# Patient Record
Sex: Male | Born: 1985
Health system: Southern US, Community
[De-identification: ages and names within clinical notes are randomized; demographics above are authoritative.]

---

## 2018-12-05 ENCOUNTER — Observation Stay (HOSPITAL_COMMUNITY): Payer: No Typology Code available for payment source | Admitting: Anesthesiology

## 2018-12-05 ENCOUNTER — Emergency Department (HOSPITAL_COMMUNITY): Payer: No Typology Code available for payment source

## 2018-12-05 ENCOUNTER — Encounter (HOSPITAL_COMMUNITY): Admission: EM | Disposition: A | Discharge status: Home / Self Care | Payer: Self-pay | Source: Home / Self Care

## 2018-12-05 ENCOUNTER — Encounter (HOSPITAL_COMMUNITY): Payer: Self-pay

## 2018-12-05 ENCOUNTER — Inpatient Hospital Stay (HOSPITAL_COMMUNITY)
Admission: EM | Admit: 2018-12-05 | Discharge: 2018-12-07 | DRG: 501 | Disposition: A | Discharge status: Home / Self Care | Payer: No Typology Code available for payment source | Attending: General Surgery | Admitting: General Surgery

## 2018-12-05 DIAGNOSIS — S31821A Laceration without foreign body of left buttock, initial encounter: Secondary | ICD-10-CM | POA: Diagnosis present

## 2018-12-05 DIAGNOSIS — Y92410 Unspecified street and highway as the place of occurrence of the external cause: Secondary | ICD-10-CM

## 2018-12-05 DIAGNOSIS — S32039A Unspecified fracture of third lumbar vertebra, initial encounter for closed fracture: Secondary | ICD-10-CM | POA: Diagnosis present

## 2018-12-05 DIAGNOSIS — S32009A Unspecified fracture of unspecified lumbar vertebra, initial encounter for closed fracture: Secondary | ICD-10-CM

## 2018-12-05 DIAGNOSIS — T1490XA Injury, unspecified, initial encounter: Secondary | ICD-10-CM | POA: Diagnosis present

## 2018-12-05 DIAGNOSIS — S81812A Laceration without foreign body, left lower leg, initial encounter: Secondary | ICD-10-CM

## 2018-12-05 DIAGNOSIS — S060X1A Concussion with loss of consciousness of 30 minutes or less, initial encounter: Secondary | ICD-10-CM

## 2018-12-05 DIAGNOSIS — M549 Dorsalgia, unspecified: Secondary | ICD-10-CM | POA: Diagnosis not present

## 2018-12-05 DIAGNOSIS — S32049A Unspecified fracture of fourth lumbar vertebra, initial encounter for closed fracture: Secondary | ICD-10-CM | POA: Diagnosis present

## 2018-12-05 DIAGNOSIS — S32000A Wedge compression fracture of unspecified lumbar vertebra, initial encounter for closed fracture: Secondary | ICD-10-CM | POA: Diagnosis present

## 2018-12-05 DIAGNOSIS — I959 Hypotension, unspecified: Secondary | ICD-10-CM | POA: Diagnosis present

## 2018-12-05 DIAGNOSIS — S32029A Unspecified fracture of second lumbar vertebra, initial encounter for closed fracture: Secondary | ICD-10-CM | POA: Diagnosis not present

## 2018-12-05 HISTORY — PX: I & D EXTREMITY: SHX5045

## 2018-12-05 LAB — PROTIME-INR
INR: 1.3 — ABNORMAL HIGH (ref 0.8–1.2)
Prothrombin Time: 15.6 seconds — ABNORMAL HIGH (ref 11.4–15.2)

## 2018-12-05 LAB — ETHANOL

## 2018-12-05 LAB — CBC
HCT: 45 % (ref 39.0–52.0)
Hemoglobin: 14.7 g/dL (ref 13.0–17.0)
MCH: 29.4 pg (ref 26.0–34.0)
MCHC: 32.7 g/dL (ref 30.0–36.0)
MCV: 90 fL (ref 80.0–100.0)
NRBC: 0 % (ref 0.0–0.2)
Platelets: 197 10*3/uL (ref 150–400)
RBC: 5 MIL/uL (ref 4.22–5.81)
RDW: 11.3 % — ABNORMAL LOW (ref 11.5–15.5)
WBC: 17.5 10*3/uL — ABNORMAL HIGH (ref 4.0–10.5)

## 2018-12-05 LAB — SAMPLE TO BLOOD BANK

## 2018-12-05 LAB — COMPREHENSIVE METABOLIC PANEL
ALT: 83 U/L — ABNORMAL HIGH (ref 0–44)
AST: 154 U/L — ABNORMAL HIGH (ref 15–41)
Albumin: 4.3 g/dL (ref 3.5–5.0)
Alkaline Phosphatase: 54 U/L (ref 38–126)
Anion gap: 10 (ref 5–15)
BILIRUBIN TOTAL: 0.8 mg/dL (ref 0.3–1.2)
BUN: 15 mg/dL (ref 6–20)
CO2: 21 mmol/L — ABNORMAL LOW (ref 22–32)
Calcium: 8.8 mg/dL — ABNORMAL LOW (ref 8.9–10.3)
Chloride: 107 mmol/L (ref 98–111)
Creatinine, Ser: 1.36 mg/dL — ABNORMAL HIGH (ref 0.61–1.24)
GFR calc Af Amer: >60 mL/min (ref >60)
Glucose, Bld: 118 mg/dL — ABNORMAL HIGH (ref 70–99)
Potassium: 3.7 mmol/L (ref 3.5–5.1)
Sodium: 138 mmol/L (ref 135–145)
TOTAL PROTEIN: 6.9 g/dL (ref 6.5–8.1)

## 2018-12-05 LAB — LACTIC ACID, PLASMA: Lactic Acid, Venous: 2.5 mmol/L (ref 0.5–1.9)

## 2018-12-05 SURGERY — IRRIGATION AND DEBRIDEMENT EXTREMITY
Anesthesia: General

## 2018-12-05 MED ORDER — PROPOFOL 10 MG/ML IV BOLUS
INTRAVENOUS | Status: AC
  Filled 2018-12-05: qty 20

## 2018-12-05 MED ORDER — MIDAZOLAM HCL 5 MG/5ML IJ SOLN
INTRAMUSCULAR | Status: DC | PRN
  Administered 2018-12-05: 2 mg via INTRAVENOUS

## 2018-12-05 MED ORDER — METHOCARBAMOL 1000 MG/10ML IJ SOLN: 500.0000 mg | Freq: Four times a day (QID) | INTRAVENOUS | Status: DC | PRN

## 2018-12-05 MED ORDER — LIDOCAINE HCL (CARDIAC) PF 100 MG/5ML IV SOSY
PREFILLED_SYRINGE | INTRAVENOUS | Status: DC | PRN
  Administered 2018-12-05: 60 mg via INTRATRACHEAL

## 2018-12-05 MED ORDER — ONDANSETRON HCL 4 MG/2ML IJ SOLN
4.0000 mg | Freq: Once | INTRAMUSCULAR | Status: AC
  Administered 2018-12-05: 4 mg via INTRAVENOUS
  Filled 2018-12-05: qty 2

## 2018-12-05 MED ORDER — SUCCINYLCHOLINE CHLORIDE 200 MG/10ML IV SOSY
PREFILLED_SYRINGE | INTRAVENOUS | Status: AC
  Filled 2018-12-05: qty 10

## 2018-12-05 MED ORDER — IOHEXOL 300 MG/ML  SOLN
100.0000 mL | Freq: Once | INTRAMUSCULAR | Status: AC | PRN
  Administered 2018-12-05: 100 mL via INTRAVENOUS

## 2018-12-05 MED ORDER — FENTANYL CITRATE (PF) 100 MCG/2ML IJ SOLN
50.0000 ug | Freq: Once | INTRAMUSCULAR | Status: AC
  Administered 2018-12-05: 50 ug via INTRAVENOUS
  Filled 2018-12-05: qty 2

## 2018-12-05 MED ORDER — ENOXAPARIN SODIUM 40 MG/0.4ML ~~LOC~~ SOLN
40.0000 mg | SUBCUTANEOUS | Status: DC
  Administered 2018-12-06 – 2018-12-07 (×2): 40 mg via SUBCUTANEOUS
  Filled 2018-12-05 (×2): qty 0.4

## 2018-12-05 MED ORDER — FENTANYL CITRATE (PF) 100 MCG/2ML IJ SOLN
100.0000 ug | INTRAMUSCULAR | Status: DC | PRN
  Administered 2018-12-05 – 2018-12-06 (×3): 100 ug via INTRAVENOUS
  Filled 2018-12-05 (×4): qty 2

## 2018-12-05 MED ORDER — HYDRALAZINE HCL 20 MG/ML IJ SOLN: 10.0000 mg | INTRAMUSCULAR | Status: DC | PRN

## 2018-12-05 MED ORDER — ARTIFICIAL TEARS OPHTHALMIC OINT
TOPICAL_OINTMENT | OPHTHALMIC | Status: AC
  Filled 2018-12-05: qty 3.5

## 2018-12-05 MED ORDER — GABAPENTIN 300 MG PO CAPS
300.0000 mg | ORAL_CAPSULE | Freq: Three times a day (TID) | ORAL | Status: DC
  Administered 2018-12-06 – 2018-12-07 (×4): 300 mg via ORAL
  Filled 2018-12-05 (×4): qty 1

## 2018-12-05 MED ORDER — ONDANSETRON HCL 4 MG/2ML IJ SOLN: 4.0000 mg | Freq: Four times a day (QID) | INTRAMUSCULAR | Status: DC | PRN

## 2018-12-05 MED ORDER — SODIUM CHLORIDE 0.9 % IV SOLN
INTRAVENOUS | Status: DC
  Administered 2018-12-06: 02:00:00 via INTRAVENOUS

## 2018-12-05 MED ORDER — OXYCODONE HCL 5 MG PO TABS
5.0000 mg | ORAL_TABLET | ORAL | Status: DC | PRN
  Administered 2018-12-06 – 2018-12-07 (×4): 5 mg via ORAL
  Filled 2018-12-05 (×4): qty 1

## 2018-12-05 MED ORDER — LACTATED RINGERS IV SOLN
INTRAVENOUS | Status: DC | PRN
  Administered 2018-12-05: 22:00:00 via INTRAVENOUS

## 2018-12-05 MED ORDER — SUCCINYLCHOLINE CHLORIDE 20 MG/ML IJ SOLN
INTRAMUSCULAR | Status: DC | PRN
  Administered 2018-12-05: 120 mg via INTRAVENOUS

## 2018-12-05 MED ORDER — SODIUM CHLORIDE 0.9 % IV SOLN
INTRAVENOUS | Status: DC
  Administered 2018-12-06 – 2018-12-07 (×3): via INTRAVENOUS

## 2018-12-05 MED ORDER — LIDOCAINE 2% (20 MG/ML) 5 ML SYRINGE
INTRAMUSCULAR | Status: AC
  Filled 2018-12-05: qty 5

## 2018-12-05 MED ORDER — METOPROLOL TARTRATE 5 MG/5ML IV SOLN: 5.0000 mg | Freq: Four times a day (QID) | INTRAVENOUS | Status: DC | PRN

## 2018-12-05 MED ORDER — HYDROMORPHONE HCL 1 MG/ML IJ SOLN
0.5000 mg | INTRAMUSCULAR | Status: DC | PRN
  Administered 2018-12-06 (×2): 0.5 mg via INTRAVENOUS
  Filled 2018-12-05 (×2): qty 1

## 2018-12-05 MED ORDER — DOCUSATE SODIUM 100 MG PO CAPS
100.0000 mg | ORAL_CAPSULE | Freq: Two times a day (BID) | ORAL | Status: DC
  Administered 2018-12-06 – 2018-12-07 (×3): 100 mg via ORAL
  Filled 2018-12-05 (×3): qty 1

## 2018-12-05 MED ORDER — BACITRACIN ZINC 500 UNIT/GM EX OINT
TOPICAL_OINTMENT | CUTANEOUS | Status: AC
  Filled 2018-12-05: qty 28.35

## 2018-12-05 MED ORDER — SODIUM CHLORIDE 0.9 % IV BOLUS
2000.0000 mL | Freq: Once | INTRAVENOUS | Status: AC
  Administered 2018-12-05: 2000 mL via INTRAVENOUS

## 2018-12-05 MED ORDER — EPHEDRINE 5 MG/ML INJ
INTRAVENOUS | Status: AC
  Filled 2018-12-05: qty 10

## 2018-12-05 MED ORDER — MIDAZOLAM HCL 2 MG/2ML IJ SOLN
INTRAMUSCULAR | Status: AC
  Filled 2018-12-05: qty 2

## 2018-12-05 MED ORDER — ACETAMINOPHEN 500 MG PO TABS
1000.0000 mg | ORAL_TABLET | Freq: Four times a day (QID) | ORAL | Status: DC
  Administered 2018-12-06 – 2018-12-07 (×5): 1000 mg via ORAL
  Filled 2018-12-05 (×6): qty 2

## 2018-12-05 MED ORDER — CEFAZOLIN SODIUM-DEXTROSE 2-4 GM/100ML-% IV SOLN
2.0000 g | Freq: Three times a day (TID) | INTRAVENOUS | Status: DC
  Administered 2018-12-06 – 2018-12-07 (×4): 2 g via INTRAVENOUS
  Filled 2018-12-05 (×6): qty 100

## 2018-12-05 MED ORDER — CEFAZOLIN SODIUM-DEXTROSE 2-4 GM/100ML-% IV SOLN
2.0000 g | Freq: Once | INTRAVENOUS | Status: AC
  Administered 2018-12-05: 2 g via INTRAVENOUS
  Filled 2018-12-05: qty 100

## 2018-12-05 MED ORDER — FENTANYL CITRATE (PF) 250 MCG/5ML IJ SOLN
INTRAMUSCULAR | Status: AC
  Filled 2018-12-05: qty 5

## 2018-12-05 MED ORDER — BISACODYL 10 MG RE SUPP: 10.0000 mg | Freq: Every day | RECTAL | Status: DC | PRN

## 2018-12-05 MED ORDER — FENTANYL CITRATE (PF) 250 MCG/5ML IJ SOLN
INTRAMUSCULAR | Status: DC | PRN
  Administered 2018-12-05: 50 ug via INTRAVENOUS
  Administered 2018-12-05: 100 ug via INTRAVENOUS
  Administered 2018-12-05 (×2): 50 ug via INTRAVENOUS

## 2018-12-05 MED ORDER — SODIUM CHLORIDE 0.9 % IV BOLUS
1000.0000 mL | Freq: Once | INTRAVENOUS | Status: AC
  Administered 2018-12-05: 1000 mL via INTRAVENOUS

## 2018-12-05 MED ORDER — ONDANSETRON 4 MG PO TBDP: 4.0000 mg | ORAL_TABLET | Freq: Four times a day (QID) | ORAL | Status: DC | PRN

## 2018-12-05 MED ORDER — PROPOFOL 10 MG/ML IV BOLUS
INTRAVENOUS | Status: DC | PRN
  Administered 2018-12-05: 200 mg via INTRAVENOUS

## 2018-12-05 SURGICAL SUPPLY — 26 items
CANISTER SUCT 3000ML PPV (MISCELLANEOUS) ×3 IMPLANT
COVER SURGICAL LIGHT HANDLE (MISCELLANEOUS) ×3 IMPLANT
DRAPE LAPAROTOMY 100X72 PEDS (DRAPES) ×3 IMPLANT
DRSG PAD ABDOMINAL 8X10 ST (GAUZE/BANDAGES/DRESSINGS) IMPLANT
ELECT CAUTERY BLADE 6.4 (BLADE) ×3 IMPLANT
ELECT REM PT RETURN 9FT ADLT (ELECTROSURGICAL) ×3
ELECTRODE REM PT RTRN 9FT ADLT (ELECTROSURGICAL) ×1 IMPLANT
GAUZE SPONGE 4X4 12PLY STRL (GAUZE/BANDAGES/DRESSINGS) ×6 IMPLANT
GLOVE BIO SURGEON STRL SZ 6 (GLOVE) ×3 IMPLANT
GLOVE INDICATOR 6.5 STRL GRN (GLOVE) ×3 IMPLANT
GOWN STRL REUS W/ TWL LRG LVL3 (GOWN DISPOSABLE) ×2 IMPLANT
GOWN STRL REUS W/TWL LRG LVL3 (GOWN DISPOSABLE) ×4
KIT BASIN OR (CUSTOM PROCEDURE TRAY) ×3 IMPLANT
KIT TURNOVER KIT B (KITS) ×3 IMPLANT
NS IRRIG 1000ML POUR BTL (IV SOLUTION) ×3 IMPLANT
PACK SURGICAL SETUP 50X90 (CUSTOM PROCEDURE TRAY) ×3 IMPLANT
PAD ARMBOARD 7.5X6 YLW CONV (MISCELLANEOUS) ×6 IMPLANT
PENCIL BUTTON HOLSTER BLD 10FT (ELECTRODE) ×3 IMPLANT
SPONGE LAP 18X18 RF (DISPOSABLE) ×3 IMPLANT
SUT VIC AB 3-0 SH 8-18 (SUTURE) ×3 IMPLANT
TAPE CLOTH SURG 6X10 WHT LF (GAUZE/BANDAGES/DRESSINGS) ×3 IMPLANT
TOWEL OR 17X26 10 PK STRL BLUE (TOWEL DISPOSABLE) ×3 IMPLANT
TUBE CONNECTING 12'X1/4 (SUCTIONS) ×1
TUBE CONNECTING 12X1/4 (SUCTIONS) ×2 IMPLANT
UNDERPAD 30X30 INCONTINENT (UNDERPADS AND DIAPERS) ×3 IMPLANT
YANKAUER SUCT BULB TIP NO VENT (SUCTIONS) ×3 IMPLANT

## 2018-12-05 NOTE — Anesthesia Procedure Notes (Signed)
Procedure Name: Intubation Date/Time: 12/05/2018 10:53 PM Performed by: Claudina Lick, CRNA Pre-anesthesia Checklist: Patient identified, Emergency Drugs available, Suction available, Patient being monitored and Timeout performed Patient Re-evaluated:Patient Re-evaluated prior to induction Oxygen Delivery Method: Circle system utilized Preoxygenation: Pre-oxygenation with 100% oxygen Induction Type: IV induction, Rapid sequence and Cricoid Pressure applied Laryngoscope Size: Miller and 2 Grade View: Grade I Tube type: Oral Tube size: 7.5 mm Number of attempts: 1 Airway Equipment and Method: Stylet Placement Confirmation: ETT inserted through vocal cords under direct vision,  positive ETCO2 and breath sounds checked- equal and bilateral Secured at: 22 cm Tube secured with: Tape Dental Injury: Teeth and Oropharynx as per pre-operative assessment
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
## 2018-12-05 NOTE — ED Notes (Signed)
Report given to OR RN. All questions answered.

## 2018-12-05 NOTE — Anesthesia Preprocedure Evaluation (Addendum)
Anesthesia Evaluation  Patient identified by MRN, date of birth, ID band Patient awake    Reviewed: Allergy & Precautions, NPO status , Patient's Chart, lab work & pertinent test results  Airway Mallampati: II  TM Distance: >3 FB Neck ROM: Full    Dental  (+) Teeth Intact, Dental Advisory Given   Pulmonary neg pulmonary ROS,    breath sounds clear to auscultation       Cardiovascular  Rhythm:Regular Rate:Normal     Neuro/Psych negative neurological ROS     GI/Hepatic negative GI ROS, Neg liver ROS,   Endo/Other  negative endocrine ROS  Renal/GU negative Renal ROS     Musculoskeletal negative musculoskeletal ROS (+)   Abdominal Normal abdominal exam  (+)   Peds  Hematology negative hematology ROS (+)   Anesthesia Other Findings   Reproductive/Obstetrics                            Anesthesia Physical Anesthesia Plan  ASA: II  Anesthesia Plan: General   Post-op Pain Management:    Induction: Intravenous, Rapid sequence and Cricoid pressure planned  PONV Risk Score and Plan: 3 and Ondansetron, Dexamethasone and Midazolam  Airway Management Planned: Oral ETT  Additional Equipment: None  Intra-op Plan:   Post-operative Plan: Extubation in OR  Informed Consent: I have reviewed the patients History and Physical, chart, labs and discussed the procedure including the risks, benefits and alternatives for the proposed anesthesia with the patient or authorized representative who has indicated his/her understanding and acceptance.     Dental advisory given  Plan Discussed with: CRNA  Anesthesia Plan Comments:       Anesthesia Quick Evaluation
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
## 2018-12-05 NOTE — ED Notes (Addendum)
No vitals documented from previous shift. Current vitals now verified

## 2018-12-05 NOTE — Op Note (Signed)
Operative Note  Paul Johnson  [id number]  [id number]  12/05/2018   Surgeon: Leeroy Bock A ConnorMD  Assistant: none  Procedure performed: debridement, irrigation and layered closure of left buttock wound, 10cm x 3cm x 10cm deep involving muscle and fascia  Preop diagnosis: traumatic laceration left buttock Post-op diagnosis/intraop findings: same  Specimens: no Retained items: no EBL: minimal cc Complications: none  Description of procedure: After obtaining informed consent the patient was taken to the operating room wheregeneral endotracheal anesthesia was initiated, preoperative antibiotics were administered, SCDs applied, and a formal timeout was performed. The patient was carefully positioned in the right lateral decubitus position with all appropriate padding and secured with straps. The left buttock was prepped and draped in usual sterile fashion using Betadine. The wound was then irrigated copiously with warm sterile saline and hemostasis ensured with cautery. There was no active vessel bleeding but a small amount of muscle belly oozing was present. The wound was very closely inspected for foreign debris and all that could be found was removed. The laceration is noted to transect multiple segments of the gluteus muscle and at the deepest aspect of the wound, the ischial tuberosity is palpable with a small amount of muscle overlying it. The wound was once again irrigated and hemostasis confirmed. At this juncture the wound was closed with interrupted 3-0 Vicryl in the fascia, interrupted deep dermal 3-0 Vicryl's, and then simple interrupted subcuticular 4-0 Monocryl. Bacitracin and a dry gauze dressing were then applied. The patient was then returned to the supine position, awakened, extubated and taken to PACU in stable condition.   All counts were correct at the completion of the case.

## 2018-12-05 NOTE — ED Notes (Signed)
RN sent 2 visitors back

## 2018-12-05 NOTE — Transfer of Care (Signed)
Immediate Anesthesia Transfer of Care Note  Patient: Paul Johnson  Procedure(s) Performed: IRRIGATION AND DEBRIDEMENT, CLOSURE OF BUTTOCK WOUND (N/A )  Patient Location: PACU  Anesthesia Type:General  Level of Consciousness: awake  Airway & Oxygen Therapy: Patient Spontanous Breathing and Patient connected to face mask oxygen  Post-op Assessment: Report given to RN and Post -op Vital signs reviewed and stable  Post vital signs: Reviewed and stable  Last Vitals:  Vitals Value Taken Time  BP 107/75 12/05/2018 11:36 PM  Temp    Pulse 111 12/05/2018 11:36 PM  Resp 39 12/05/2018 11:36 PM  SpO2 93 % 12/05/2018 11:36 PM  Vitals shown include unvalidated device data.  Last Pain:  Vitals:   12/05/18 2123  TempSrc:   PainSc: 10-Worst pain ever         Complications: No apparent anesthesia complications
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
## 2018-12-06 ENCOUNTER — Encounter (HOSPITAL_COMMUNITY): Payer: Self-pay | Admitting: Surgery

## 2018-12-06 DIAGNOSIS — S060X1A Concussion with loss of consciousness of 30 minutes or less, initial encounter: Secondary | ICD-10-CM | POA: Diagnosis present

## 2018-12-06 DIAGNOSIS — Y92410 Unspecified street and highway as the place of occurrence of the external cause: Secondary | ICD-10-CM | POA: Diagnosis not present

## 2018-12-06 DIAGNOSIS — I959 Hypotension, unspecified: Secondary | ICD-10-CM | POA: Diagnosis present

## 2018-12-06 DIAGNOSIS — S31821A Laceration without foreign body of left buttock, initial encounter: Secondary | ICD-10-CM | POA: Diagnosis present

## 2018-12-06 DIAGNOSIS — S32049A Unspecified fracture of fourth lumbar vertebra, initial encounter for closed fracture: Secondary | ICD-10-CM | POA: Diagnosis present

## 2018-12-06 DIAGNOSIS — S32039A Unspecified fracture of third lumbar vertebra, initial encounter for closed fracture: Secondary | ICD-10-CM | POA: Diagnosis present

## 2018-12-06 DIAGNOSIS — S32029A Unspecified fracture of second lumbar vertebra, initial encounter for closed fracture: Secondary | ICD-10-CM | POA: Diagnosis present

## 2018-12-06 DIAGNOSIS — M549 Dorsalgia, unspecified: Secondary | ICD-10-CM | POA: Diagnosis present

## 2018-12-06 DIAGNOSIS — T1490XA Injury, unspecified, initial encounter: Secondary | ICD-10-CM | POA: Diagnosis present

## 2018-12-06 LAB — BASIC METABOLIC PANEL
Anion gap: 10 (ref 5–15)
BUN: 12 mg/dL (ref 6–20)
CO2: 21 mmol/L — ABNORMAL LOW (ref 22–32)
Calcium: 7.9 mg/dL — ABNORMAL LOW (ref 8.9–10.3)
Chloride: 106 mmol/L (ref 98–111)
Creatinine, Ser: 1.29 mg/dL — ABNORMAL HIGH (ref 0.61–1.24)
GFR calc Af Amer: >60 mL/min (ref >60)
GFR calc non Af Amer: >60 mL/min (ref >60)
Glucose, Bld: 107 mg/dL — ABNORMAL HIGH (ref 70–99)
Potassium: 5.1 mmol/L (ref 3.5–5.1)
Sodium: 137 mmol/L (ref 135–145)

## 2018-12-06 LAB — HIV ANTIBODY (ROUTINE TESTING W REFLEX): HIV Screen 4th Generation wRfx: NONREACTIVE

## 2018-12-06 LAB — CBC
HCT: 38.7 % — ABNORMAL LOW (ref 39.0–52.0)
Hemoglobin: 13 g/dL (ref 13.0–17.0)
MCH: 30.2 pg (ref 26.0–34.0)
MCHC: 33.6 g/dL (ref 30.0–36.0)
MCV: 90 fL (ref 80.0–100.0)
Platelets: 150 10*3/uL (ref 150–400)
RBC: 4.3 MIL/uL (ref 4.22–5.81)
RDW: 11.5 % (ref 11.5–15.5)
WBC: 10.3 10*3/uL (ref 4.0–10.5)
nRBC: 0 % (ref 0.0–0.2)

## 2018-12-06 MED ORDER — HYDROMORPHONE HCL 1 MG/ML IJ SOLN: 0.2500 mg | INTRAMUSCULAR | Status: DC | PRN

## 2018-12-06 MED ORDER — LACTATED RINGERS IV SOLN: INTRAVENOUS | Status: DC

## 2018-12-06 MED ORDER — ACETAMINOPHEN 160 MG/5ML PO SOLN: 325.0000 mg | Freq: Once | ORAL | Status: DC

## 2018-12-06 MED ORDER — MEPERIDINE HCL 50 MG/ML IJ SOLN: 6.2500 mg | INTRAMUSCULAR | Status: DC | PRN

## 2018-12-06 MED ORDER — PROMETHAZINE HCL 25 MG/ML IJ SOLN: 6.2500 mg | INTRAMUSCULAR | Status: DC | PRN

## 2018-12-06 MED ORDER — ACETAMINOPHEN 325 MG PO TABS: 325.0000 mg | ORAL_TABLET | Freq: Once | ORAL | Status: DC

## 2018-12-06 MED ORDER — METHOCARBAMOL 500 MG PO TABS
500.0000 mg | ORAL_TABLET | Freq: Three times a day (TID) | ORAL | Status: DC
  Administered 2018-12-06 (×3): 500 mg via ORAL
  Filled 2018-12-06 (×3): qty 1

## 2018-12-06 MED ORDER — ACETAMINOPHEN 10 MG/ML IV SOLN: 1000.0000 mg | Freq: Once | INTRAVENOUS | Status: DC | PRN

## 2018-12-06 MED ORDER — IBUPROFEN 600 MG PO TABS
600.0000 mg | ORAL_TABLET | Freq: Four times a day (QID) | ORAL | Status: DC | PRN
  Administered 2018-12-07: 600 mg via ORAL
  Filled 2018-12-06: qty 1
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
## 2018-12-06 NOTE — Progress Notes (Signed)
PT Cancellation Note  Patient Details Name: Paul Johnson MRN: [id number] DOB: 03/27/1986   Cancelled Treatment:    Reason Eval/Treat Not Completed: Other (comment)   Noted TLSO is not delivered to room yet, but that [employer] was called;  Will hold PT for now, but I do anticipate the brace today, and will return to mobilize as time allows;   Van Clines, [place]  [facility] Pager [phone] Office [phone]    Levi Aland 12/06/2018, 9:16 AM

## 2018-12-06 NOTE — Anesthesia Postprocedure Evaluation (Signed)
Anesthesia Post Note  Patient: Paul Johnson  Procedure(s) Performed: IRRIGATION AND DEBRIDEMENT, CLOSURE OF BUTTOCK WOUND (N/A )     Patient location during evaluation: PACU Anesthesia Type: General Level of consciousness: awake and alert Pain management: pain level controlled Vital Signs Assessment: post-procedure vital signs reviewed and stable Respiratory status: spontaneous breathing, nonlabored ventilation, respiratory function stable and patient connected to nasal cannula oxygen Cardiovascular status: blood pressure returned to baseline and stable Postop Assessment: no apparent nausea or vomiting Anesthetic complications: no    Last Vitals:  Vitals:   12/06/18 1430 12/06/18 1815  BP: (!) 103/57 112/67  Pulse: 88 90  Resp: (!) 21 18  Temp: 37.2 C 37.1 C  SpO2: 92% 93%    Last Pain:  Vitals:   12/06/18 1815  TempSrc: Oral  PainSc:                  Shelton Silvas

## 2018-12-06 NOTE — Progress Notes (Signed)
Orthopedic Tech Progress Note Patient Details:  Paul Johnson September 13, 1986 [id number]  Patient ID: [name], male   DOB: [birth year], 33 y.o.   MRN: [id number]   Paul Johnson 12/06/2018, 8:36 AM Called [employer] for TLSO brace.
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
## 2018-12-07 ENCOUNTER — Encounter (HOSPITAL_COMMUNITY): Payer: Self-pay | Admitting: General Practice

## 2018-12-07 ENCOUNTER — Other Ambulatory Visit: Payer: Self-pay

## 2018-12-07 MED ORDER — SACCHAROMYCES BOULARDII 250 MG PO CAPS: 250.0000 mg | ORAL_CAPSULE | Freq: Two times a day (BID) | ORAL | 0 refills | Status: AC

## 2018-12-07 MED ORDER — METHOCARBAMOL 750 MG PO TABS
750.0000 mg | ORAL_TABLET | Freq: Three times a day (TID) | ORAL | Status: DC
  Administered 2018-12-07 (×2): 750 mg via ORAL
  Filled 2018-12-07 (×2): qty 1

## 2018-12-07 MED ORDER — METHOCARBAMOL 750 MG PO TABS: 750.0000 mg | ORAL_TABLET | Freq: Three times a day (TID) | ORAL | 0 refills | Status: AC

## 2018-12-07 MED ORDER — GABAPENTIN 300 MG PO CAPS: 300.0000 mg | ORAL_CAPSULE | Freq: Three times a day (TID) | ORAL | 0 refills | Status: AC

## 2018-12-07 MED ORDER — IBUPROFEN 600 MG PO TABS: 600.0000 mg | ORAL_TABLET | Freq: Four times a day (QID) | ORAL | 0 refills | Status: AC | PRN

## 2018-12-07 MED ORDER — CEPHALEXIN 250 MG PO CAPS: 250.0000 mg | ORAL_CAPSULE | Freq: Four times a day (QID) | ORAL | 0 refills | Status: AC

## 2018-12-07 MED ORDER — ACETAMINOPHEN 500 MG PO TABS: 1000.0000 mg | ORAL_TABLET | Freq: Four times a day (QID) | ORAL | 0 refills | Status: AC | PRN

## 2018-12-07 MED ORDER — OXYCODONE HCL 5 MG PO TABS: 5.0000 mg | ORAL_TABLET | Freq: Four times a day (QID) | ORAL | 0 refills | Status: AC | PRN

## 2018-12-07 MED FILL — METHOCARBAMOL 500 MG TABLET: 500 | 20 days supply | Qty: 90 | Fill #0

## 2018-12-07 MED FILL — CEPHALEXIN 250 MG CAPSULE: 250 | 7 days supply | Qty: 28 | Fill #0

## 2018-12-07 MED FILL — GABAPENTIN 300 MG CAPSULE: 300 | 30 days supply | Qty: 90 | Fill #0

## 2018-12-07 MED FILL — FLORASTOR 250 MG CAPSULE: 250 | 7 days supply | Qty: 14 | Fill #0

## 2018-12-07 MED FILL — oxyCODONE HCL 5 MG TABS: 5 | 8 days supply | Qty: 30 | Fill #0
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
## 2018-12-07 NOTE — Discharge Instructions (Signed)
Continue antibiotics (keflex) for 7 days. I have also written for you to receive a probiotic (florastor) - try to separate this from antibiotic dose by 2-3 hrs.    Lumbar Spine Fracture A lumbar spine fracture is a break in one of the bones of the lower back. Lumbar spine fractures can vary from mild to severe. The most severe types are those that:  Cause the broken bones to move out of place (unstable).  Injure or press on the spinal cord. During recovery, it is normal to have pain and stiffness in the lower back for weeks. What are the causes? This condition may be caused by:  A fall.  A car accident.  A gunshot wound.  A hard, direct hit to the back. What increases the risk? You are more likely to develop this condition if:  You are in a situation that could result in a fall or other violent injury.  You have a condition that causes weakness in the bones (osteoporosis). What are the signs or symptoms? The main symptom of this condition is severe pain in the lower back. If a fracture is complex or severe, there may also be:  A misshapen or swollen area on the lower back.  Limited ability to move an area of the lower back.  Inability to empty the bladder (urinary retention).  Loss of bowel or bladder control (incontinence).  Loss of strength or sensation in the legs, feet, and toes.  Inability to move (paralysis). How is this diagnosed? This condition is diagnosed based on:  A physical exam.  Symptoms and what happened just before they developed.  The results of imaging tests, such as an X-ray, CT scan, or MRI. If your nerves have been damaged, you may also have other tests to find out the extent of the damage. How is this treated? Treatment for this condition depends on how severe the injury is. Most fractures can be treated with:  A back brace.  Bed rest and activity restrictions.  Pain medicine.  Physical therapy. Fractures that are complex, involve  multiple bones, or make the spine unstable may require surgery. Surgery is done:  To remove pressure from the nerves or spinal cord.  To stabilize the broken pieces of bone. Follow these instructions at home: Medicines  Take over-the-counter and prescription medicines only as told by your health care provider.  Do not drive or use heavy machinery while taking prescription pain medicine.  If you are taking prescription pain medicine, take actions to prevent or treat constipation. Your health care provider may recommend that you: ? Drink enough fluid to keep your urine pale yellow. ? Eat foods that are high in fiber, such as fresh fruits and vegetables, whole grains, and beans. ? Limit foods that are high in fat and processed sugars, such as fried or sweet foods. ? Take an over-the-counter or prescription medicine for constipation. If you have a brace:  Wear the back brace as told by your health care provider. Remove it only as told by your health care provider.  Keep the brace clean.  If the brace is not waterproof: ? Do not let it get wet. ? Cover it with a watertight covering when you take a bath or a shower. Activity  Stay in bed (on bed rest) only as directed by your health care provider.  Do exercises to improve motion and strength in your back (physical therapy), if your health care provider tells you to do so.  Return to your  normal activities as directed by your health care provider. Ask your health care provider what activities are safe for you. Managing pain, stiffness, and swelling   If directed, put ice on the injured area: ? If you have a removable brace, remove it as told by your health care provider. ? Put ice in a plastic bag. ? Place a towel between your skin and the bag. ? Leave the ice on for 20 minutes, 2-3 times a day. General instructions  Do not use any products that contain nicotine or tobacco, such as cigarettes and e-cigarettes. These can delay  healing after injury. If you need help quitting, ask your health care provider.  Do not drink alcohol. Alcohol can interfere with your treatment.  Keep all follow-up visits as directed by your health care provider. This is important. ? Failing to follow up as recommended could result in permanent injury, disability, or long-lasting (chronic) pain. Contact a health care provider if:  You have a fever.  Your pain medicine is not helping.  Your pain does not get better over time.  You cannot return to your normal activities as planned or expected. Get help right away if:  You have difficulty breathing.  Your pain is very bad and it suddenly gets worse.  You have numbness, tingling, or weakness in any part of your body.  You are unable to empty your bladder.  You cannot control your bladder or bowels.  You are unable to move any body part (paralysis) that is below the level of your injury.  You vomit.  You have pain in your abdomen. Summary  A lumbar spine fracture is a break in one of the bones of the lower back.  The main symptom of this condition is severe pain in the lower back. If a fracture is complex, there may also be numbness, tingling, or paralysis in the legs.  Treatment depends on how severe the injury is. Most fractures can be treated with a back brace, bed rest and activity restrictions, pain medicine, and physical therapy.  Fractures that are complex, involve multiple bones, or make the spine unstable may require surgery. This information is not intended to replace advice given to you by your health care provider. Make sure you discuss any questions you have with your health care provider. Document Released: 01/06/2007 Document Revised: 11/06/2017 Document Reviewed: 11/06/2017 Elsevier Interactive Patient Education  2019 [employer]   How to Use a Back Brace  A back brace is a form-fitting device that wraps around your trunk to support your lower back,  abdomen, and hips. You may need to wear a back brace to relieve back pain or to correct a medical condition related to the back, such as abnormal curvature of the spine (scoliosis). A back brace can maintain or correct the shape of the spine and prevent a spinal problem from getting worse. A back brace can also take pressure off the layers of tissue (disks) between the bones of the spine (vertebrae). You may need a back brace to keep your back and spine in place while you heal from an injury or recover from surgery. Back braces can be either plastic (rigid brace) or soft elastic (dynamic brace). A rigid brace usually covers both the front and back of the entire upper body. A soft brace may cover only the lower back and abdomen and may fasten with self-adhesive elastic straps. Your health care provider will recommend the proper brace for your needs and medical condition. What are  the risks?  A back brace may not help if you do not wear it as directed by your health care provider. Be sure to wear the brace exactly as instructed in order to prevent further back problems.  Wearing the brace may be uncomfortable at first. You may have trouble sleeping with it on. It may also be hard for you to do certain activities while wearing the brace. How to use a back brace Different types of braces will have different instructions for use. Follow instructions from your health care provider about:  How to put on the brace.  When and how often to wear the brace. In some cases, braces may need to be worn for long stretches of time. For example, a brace may need to be worn for 16-23 hours a day when used for scoliosis.  How to take off the brace.  Any safety tips you should follow when wearing the brace. This may include: ? Moving carefully while wearing the brace. The brace restricts your movement and could lead to additional injuries. ? Using a cane or walker for support if you feel unsteady. ? Sitting in high,  firm chairs. It may be difficult to stand up from low, soft chairs. How to care for a back brace  Do not let the back brace get wet. Typically, you will remove the brace for bathing and then put it back on afterward.  If you have a rigid brace, be sure to store it in a safe place when you are not wearing it. This will help to prevent damage.  Clean or wash the back brace with mild soap and water as told by your health care provider. Contact a health care provider if:  Your brace gets damaged.  You have pain or discomfort when wearing the back brace.  Your back pain is getting worse or is not improving over time. This information is not intended to replace advice given to you by your health care provider. Make sure you discuss any questions you have with your health care provider. Document Released: 09/10/2011 Document Revised: 10/17/2015 Document Reviewed: 05/15/2015 Elsevier Interactive Patient Education  2019 [employer]   Laceration Care, Adult A laceration is a cut that may go through all layers of the skin. The cut may also go into the tissue that is right under the skin. Some cuts heal on their own. Others need to be closed with stitches (sutures), staples, skin adhesive strips, or skin glue. Taking care of your injury lowers your risk of infection, helps your injury to heal better, and may prevent scarring. Supplies needed:  Soap.  Water.  Hand sanitizer.  Bandage (dressing).  Antibiotic ointment.  Clean towel. How to take care of your cut Wash your hands with soap and water before touching your wound or changing your bandage. If soap and water are not available, use hand sanitizer. If your doctor used stitches or staples:  Keep the wound clean and dry.  If you were given a bandage, change it at least once a day as told by your doctor. You should also change it if it gets wet or dirty.  Keep the wound completely dry for the first 24 hours, or as told by your  doctor. After that, you may take a shower or a bath. Do not get the wound soaked in water until after the stitches or staples have been removed.  Clean the wound once a day, or as told by your doctor: ? Wash the wound  with soap and water. ? Rinse the wound with water to remove all soap. ? Pat the wound dry with a clean towel. Do not rub the wound.  After you clean the wound, put a thin layer of antibiotic ointment on it as told by your doctor. This ointment: ? Helps to prevent infection. ? Keeps the bandage from sticking to the wound.  Have your stitches or staples removed as told by your doctor. If your doctor used skin adhesive strips:  Keep the wound clean and dry.  If you were given a bandage, you should change it at least once a day as told by your doctor. You should also change it if it gets wet or dirty.  Do not get the skin adhesive strips wet. You can take a shower or a bath, but keep the wound dry.  If the wound gets wet, pat it dry with a clean towel. Do not rub the wound.  Skin adhesive strips fall off on their own. You can trim the strips as the wound heals. Do not remove any strips that are still stuck to the wound. They will fall off after a while. If your doctor used skin glue:  Try to keep your wound dry, but you may briefly wet it in the shower or bath. Do not soak the wound in water, such as by swimming.  After you take a shower or a bath, gently pat the wound dry with a clean towel. Do not rub the wound.  Do not do any activities that will make you really sweaty until the skin glue has fallen off on its own.  Do not apply liquid, cream, or ointment medicine to your wound while the skin glue is still on.  If you were given a bandage, you should change it at least once a day or as told by your doctor. You should also change it if it gets dirty or wet.  If a bandage is placed over the wound, do not let the tape touch the skin glue.  Do not pick at the glue. The  skin glue usually stays on for 5-10 days. Then, it falls off the skin. General instructions   Take over-the-counter and prescription medicines only as told by your doctor.  If you were given antibiotic medicine or ointment, take or apply it as told by your doctor. Do not stop using it even if your condition improves.  Do not scratch or pick at the wound.  Check your wound every day for signs of infection. Watch for: ? Redness, swelling, or pain. ? Fluid, blood, or pus.  Raise (elevate) the injured area above the level of your heart while you are sitting or lying down.  If directed, put ice on the affected area: ? Put ice in a plastic bag. ? Place a towel between your skin and the bag. ? Leave the ice on for 20 minutes, 2-3 times a day.  Prevent scarring by covering your wound with sunscreen of at least 30 SPF whenever you are outside after your wound has healed.  Keep all follow-up visits as told by your doctor. This is important. Get help if:  You got a tetanus shot and you have any of these problems at the injection site: ? Swelling. ? Very bad pain. ? Redness. ? Bleeding.  You have a fever.  A wound that was closed breaks open.  You notice a bad smell coming from your wound or your bandage.  You notice something coming  out of the wound, such as wood or glass.  Medicine does not relieve your pain.  You have more redness, swelling, or pain at the site of your wound.  You have fluid, blood, or pus coming from your wound.  You notice a change in the color of your skin near your wound.  You need to change the bandage often because fluid, blood, or pus is coming from the wound.  You start to have a new rash.  You start to have numbness around the wound. Get help right away if:  You have very bad swelling around the wound.  Your pain suddenly gets worse and is very bad.  You notice painful lumps near the wound or anywhere on your body.  You have a red streak  going away from your wound.  The wound is on your hand or foot, and: ? You cannot move a finger or toe. ? Your fingers or toes look pale or bluish. Summary  A laceration is a cut that may go through all layers of the skin. The cut may also go into the tissue right under the skin.  Some cuts heal on their own. Others need to be closed with stitches, staples, skin adhesive strips, or skin glue.  Follow your doctor's instructions for caring for your cut. Proper care of a cut lowers the risk of infection, helps the cut heal better, and prevents scarring. This information is not intended to replace advice given to you by your health care provider. Make sure you discuss any questions you have with your health care provider. Document Released: 03/09/2008 Document Revised: 10/11/2017 Document Reviewed: 10/11/2017 Elsevier Interactive Patient Education  2019 [employer].
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
## 2018-12-07 NOTE — Social Work (Signed)
Attempted SBIRT, pt out of room on unit ambulating with friend.  Will reattempt as able.  Octavio Graves, MSW, [facility] Clinical Social Work [phone]
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
